# Patient Record
Sex: Male | Born: 1970 | Race: White | Hispanic: No | Marital: Married | Smoking: Current every day smoker
Health system: Southern US, Community
[De-identification: ages and names within clinical notes are randomized; demographics above are authoritative.]

---

## 2003-10-10 ENCOUNTER — Ambulatory Visit (HOSPITAL_COMMUNITY): Admission: RE | Admit: 2003-10-10 | Discharge: 2003-10-10 | Payer: Self-pay | Admitting: Orthopedic Surgery

## 2003-10-10 ENCOUNTER — Ambulatory Visit (HOSPITAL_BASED_OUTPATIENT_CLINIC_OR_DEPARTMENT_OTHER): Admission: RE | Admit: 2003-10-10 | Discharge: 2003-10-10 | Payer: Self-pay | Admitting: Orthopedic Surgery

## 2007-09-12 ENCOUNTER — Emergency Department: Payer: Self-pay | Admitting: Emergency Medicine

## 2009-05-16 IMAGING — CR DG SHOULDER 3+V*L*
1 series · 3 of 3 positions shown · non-contrast
Comparison: none

REASON FOR EXAM: fall   pt in [facility]
COMMENTS:

[Series 1: view not recorded · 0.17mm/px · 3 of 3 slices shown]
[im 1/3]
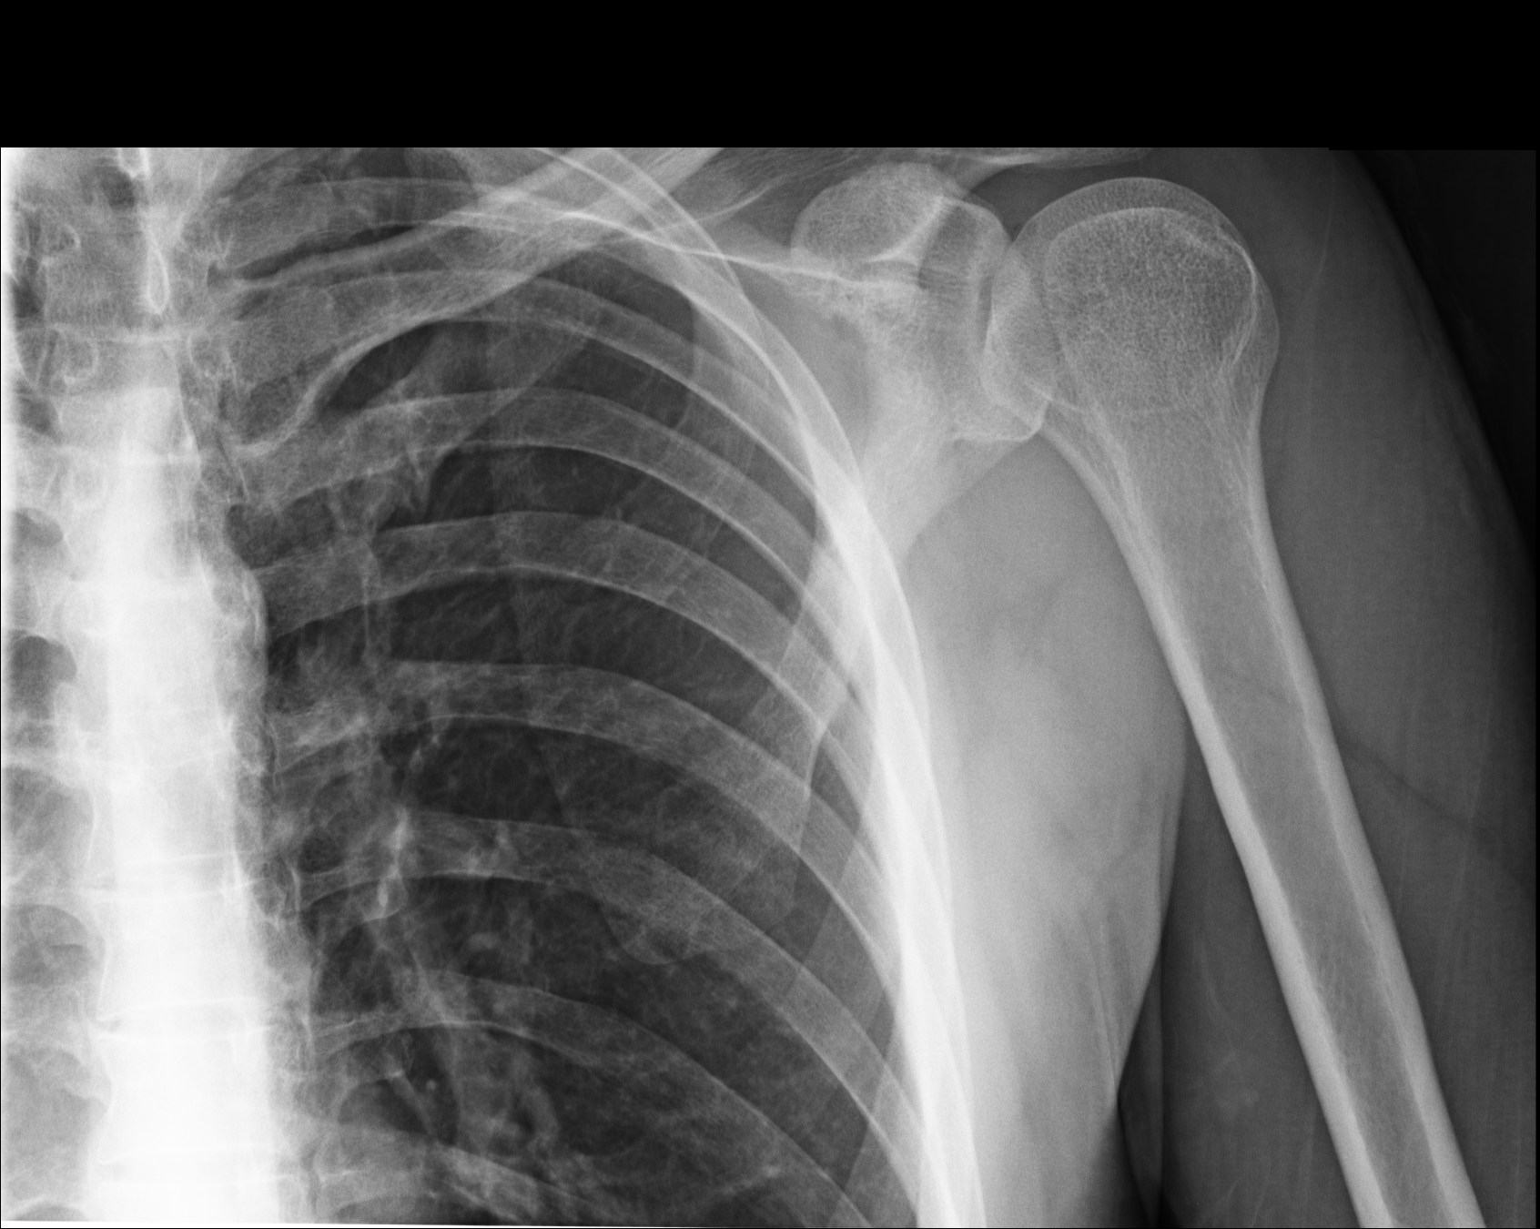
[im 2/3]
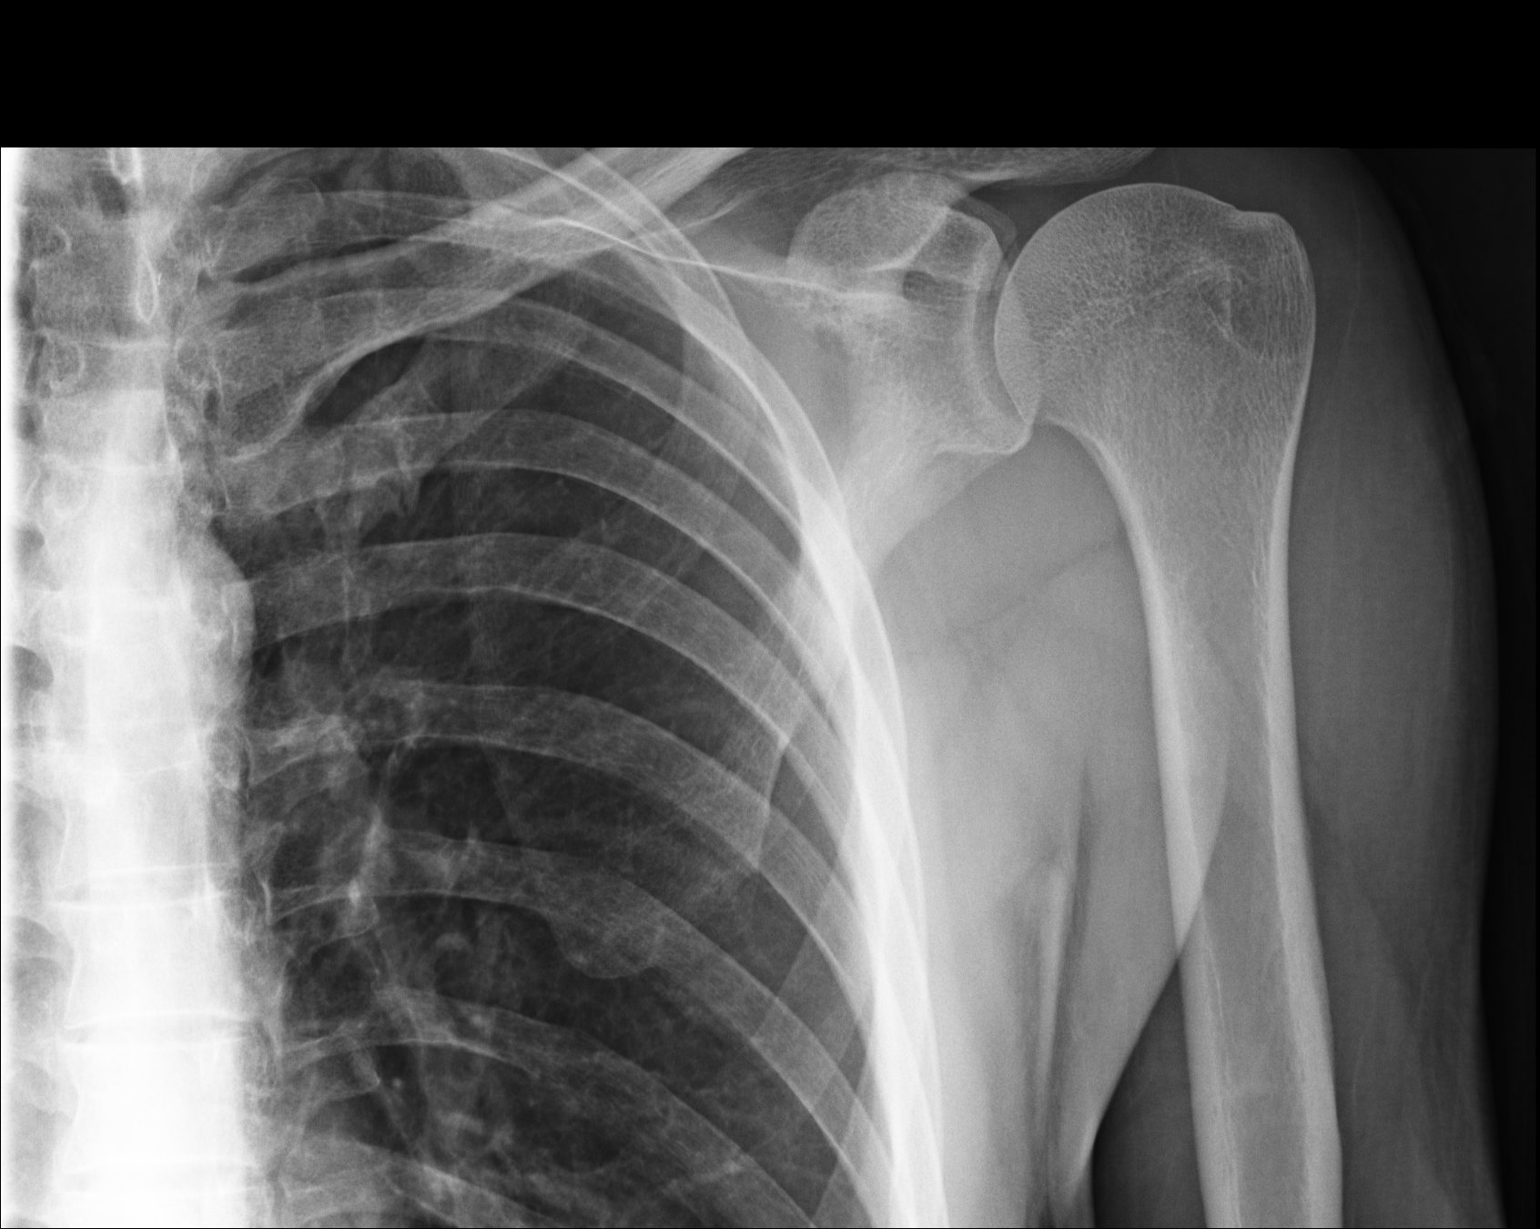
[im 3/3]
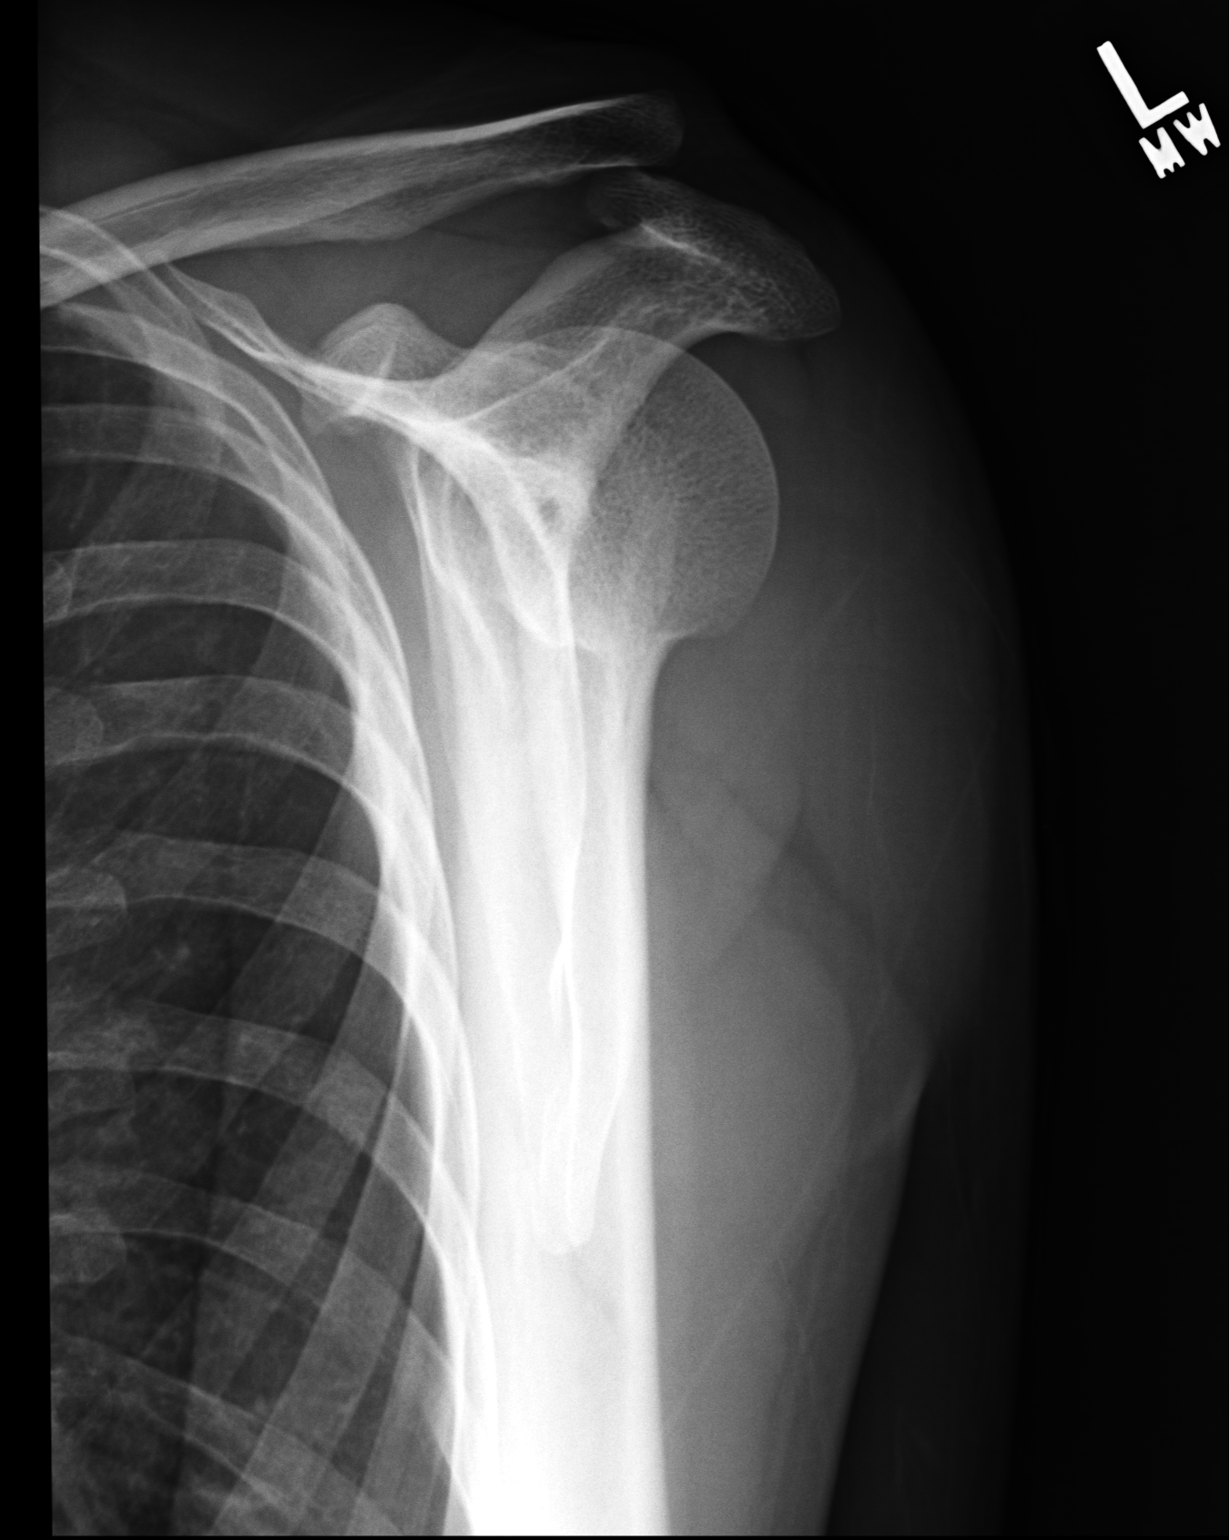

[3 of 3 positions shown; findings below may reference images not displayed]

PROCEDURE:     DXR - DXR SHOULDER LEFT COMPLETE  - September 12, 2007 [DATE]

RESULT:     The LEFT shoulder reveals the bones to be adequately
mineralized. I do not see evidence of an acute fracture. The AC joint and
glenohumeral joints are grossly intact. There is no evidence of an acute
clavicular fracture.
IMPRESSION: I see no acute bony abnormality of the LEFT shoulder.  If
there are symptoms referable to the AC joint, a dedicated AC joint series
would be useful.

## 2015-04-07 ENCOUNTER — Emergency Department
Admission: EM | Admit: 2015-04-07 | Discharge: 2015-04-07 | Disposition: A | Discharge status: Home / Self Care | Payer: Worker's Compensation | Attending: Emergency Medicine | Admitting: Emergency Medicine

## 2015-04-07 ENCOUNTER — Encounter: Payer: Self-pay | Admitting: Emergency Medicine

## 2015-04-07 ENCOUNTER — Emergency Department: Payer: Worker's Compensation

## 2015-04-07 DIAGNOSIS — S9031XA Contusion of right foot, initial encounter: Secondary | ICD-10-CM | POA: Insufficient documentation

## 2015-04-07 DIAGNOSIS — Y990 Civilian activity done for income or pay: Secondary | ICD-10-CM | POA: Insufficient documentation

## 2015-04-07 DIAGNOSIS — Y9389 Activity, other specified: Secondary | ICD-10-CM | POA: Diagnosis not present

## 2015-04-07 DIAGNOSIS — X58XXXA Exposure to other specified factors, initial encounter: Secondary | ICD-10-CM | POA: Diagnosis not present

## 2015-04-07 DIAGNOSIS — Y9289 Other specified places as the place of occurrence of the external cause: Secondary | ICD-10-CM | POA: Insufficient documentation

## 2015-04-07 DIAGNOSIS — S99921A Unspecified injury of right foot, initial encounter: Secondary | ICD-10-CM | POA: Diagnosis present

## 2015-04-07 DIAGNOSIS — F17200 Nicotine dependence, unspecified, uncomplicated: Secondary | ICD-10-CM | POA: Insufficient documentation

## 2015-04-07 MED ORDER — TRAMADOL HCL 50 MG PO TABS
50.0000 mg | ORAL_TABLET | Freq: Once | ORAL | Status: AC
  Administered 2015-04-07: 50 mg via ORAL
  Filled 2015-04-07: qty 1

## 2015-04-07 MED ORDER — NAPROXEN 500 MG PO TBEC: 500.0000 mg | DELAYED_RELEASE_TABLET | Freq: Two times a day (BID) | ORAL | Status: AC

## 2015-04-07 MED ORDER — TRAMADOL HCL 50 MG PO TABS: 50.0000 mg | ORAL_TABLET | Freq: Two times a day (BID) | ORAL | Status: AC

## 2015-04-07 NOTE — Discharge Instructions (Signed)
Foot Contusion A foot contusion is a deep bruise to the foot. Contusions are the result of an injury that caused bleeding under the skin. The contusion may turn blue, purple, or yellow. Minor injuries will give you a painless contusion, but more severe contusions may stay painful and swollen for a few weeks. CAUSES  A foot contusion comes from a direct blow to that area, such as a heavy object falling on the foot. SYMPTOMS   Swelling of the foot.  Discoloration of the foot.  Tenderness or soreness of the foot. DIAGNOSIS  You will have a physical exam and will be asked about your history. You may need an X-ray of your foot to look for a broken bone (fracture).  TREATMENT  An elastic wrap may be recommended to support your foot. Resting, elevating, and applying cold compresses to your foot are often the best treatments for a foot contusion. Over-the-counter medicines may also be recommended for pain control. HOME CARE INSTRUCTIONS   Put ice on the injured area.  Put ice in a plastic bag.  Place a towel between your skin and the bag.  Leave the ice on for 15-20 minutes, 03-04 times a day.  Only take over-the-counter or prescription medicines for pain, discomfort, or fever as directed by your caregiver.  If told, use an elastic wrap as directed. This can help reduce swelling. You may remove the wrap for sleeping, showering, and bathing. If your toes become numb, cold, or blue, take the wrap off and reapply it more loosely.  Elevate your foot with pillows to reduce swelling.  Try to avoid standing or walking while the foot is painful. Do not resume use until instructed by your caregiver. Then, begin use gradually. If pain develops, decrease use. Gradually increase activities that do not cause discomfort until you have normal use of your foot.  See your caregiver as directed. It is very important to keep all follow-up appointments in order to avoid any lasting problems with your foot,  including long-term (chronic) pain. SEEK IMMEDIATE MEDICAL CARE IF:   You have increased redness, swelling, or pain in your foot.  Your swelling or pain is not relieved with medicines.  You have loss of feeling in your foot or are unable to move your toes.  Your foot turns cold or blue.  You have pain when you move your toes.  Your foot becomes warm to the touch.  Your contusion does not improve in 2 days. MAKE SURE YOU:   Understand these instructions.  Will watch your condition.  Will get help right away if you are not doing well or get worse.   This information is not intended to replace advice given to you by your health care provider. Make sure you discuss any questions you have with your health care provider.   Document Released: 01/31/2006 Document Revised: 10/11/2011 Document Reviewed: 12/16/2014 Elsevier Interactive Patient Education [employer]2016 [employer]   Your exam and x-ray are normal following your accident. There is no evidence of fracture or dislocation. Wear the ace bandage and needed for support. Apply ice and take the prescription Naprosyn as needed for pain relief. [facility] or your company's provider as needed.

## 2015-04-07 NOTE — ED Notes (Signed)
Pt reports being at work when a large 1100lb piston rolled over the top of his foot.  Pt was wearing a steel-toe boot, and reports he worked on his foot all day after the injury.  Pt states that it wasn't until after he took his boot off that the pain began.

## 2015-04-07 NOTE — ED Notes (Signed)
Pt presents with right foot pain after a jack rolled of a dolly and onto right foot. Weight of jack was 1175lbs. Happened this am at work, now having pain and swelling.

## 2015-04-07 NOTE — ED Provider Notes (Signed)
[facility] Emergency Department Provider Note ____________________________________________  Time seen: 1725  I have reviewed the triage vital signs and the nursing notes.  HISTORY  Chief Complaint  Foot Pain  HPI Anthony Burke is a 44 y.o. male reports to the ED from the job site after he was injured at work this morning about 9 AM. He describes that a large 1175 pound jack rolled off of a dolly and onto his right foot. He continued to work throughout the day, and now presents to the ED for evaluation of his injury. He was wearing steel toe boots as is required, and denies any cuts, scrapes, or abrasions to the foot. He rates his pain a 9/10 in triage.  History reviewed. No pertinent past medical history.  There are no active problems to display for this patient.  History reviewed. No pertinent past surgical history.  Current Outpatient Rx  Name  Route  Sig  Dispense  Refill  . naproxen (EC NAPROSYN) 500 MG EC tablet   Oral   Take 1 tablet (500 mg total) by mouth 2 (two) times daily with a meal.   30 tablet   0   . traMADol (ULTRAM) 50 MG tablet   Oral   Take 1 tablet (50 mg total) by mouth 2 (two) times daily.   10 tablet   0    Allergies Review of patient's allergies indicates no known allergies.  No family history on file.  Social History Social History  Substance Use Topics  . Smoking status: Current Every Day Smoker  . Smokeless tobacco: None  . Alcohol Use: No    Review of Systems  Constitutional: Negative for fever. Eyes: Negative for visual changes. ENT: Negative for sore throat. Cardiovascular: Negative for chest pain. Respiratory: Negative for shortness of breath. Gastrointestinal: Negative for abdominal pain, vomiting and diarrhea. Genitourinary: Negative for dysuria. Musculoskeletal: Negative for back pain. Skin: Negative for rash. Neurological: Negative for headaches, focal weakness or  numbness. ____________________________________________  PHYSICAL EXAM:  VITAL SIGNS: ED Triage Vitals  Enc Vitals Group     BP 04/07/15 1708 133/85 mmHg     Pulse Rate 04/07/15 1708 110     Resp 04/07/15 1708 20     Temp 04/07/15 1708 98.5 F (36.9 C)     Temp Source 04/07/15 1708 Oral     SpO2 04/07/15 1708 99 %     Weight 04/07/15 1708 155 lb (70.308 kg)     Height 04/07/15 1708  (1.778 m)     Head Cir --      Peak Flow --      Pain Score 04/07/15 1708 9     Pain Loc --      Pain Edu? --      Excl. in GC? --     Constitutional: Alert and oriented. Well appearing and in no distress. Head: Normocephalic and atraumatic.      Eyes: Conjunctivae are normal. PERRL. Normal extraocular movements      Ears: Canals clear. TMs intact bilaterally.   Nose: No congestion/rhinorrhea.   Mouth/Throat: Mucous membranes are moist.   Neck: Supple. No thyromegaly. Hematological/Lymphatic/Immunological: No cervical lymphadenopathy. Cardiovascular: Normal rate, regular rhythm.  Respiratory: Normal respiratory effort. No wheezes/rales/rhonchi. Gastrointestinal: Soft and nontender. No distention. Musculoskeletal: Nontender with normal range of motion in all extremities.  Neurologic:  Normal gait without ataxia. Normal speech and language. No gross focal neurologic deficits are appreciated. Skin:  Skin is warm, dry and intact. No  rash noted. Psychiatric: Mood and affect are normal. Patient exhibits appropriate insight and judgment. ____________________________________________   RADIOLOGY Right Foot IMPRESSION: Negative  I, Jaryiah Mehlman, Charlesetta IvoryJenise V Bacon, personally viewed and evaluated these images (plain radiographs) as part of my medical decision making.  ____________________________________________  PROCEDURES  Ultram 50 mg PO Ace wrap ____________________________________________  INITIAL IMPRESSION / ASSESSMENT AND PLAN / ED COURSE  Patient discharged with a diagnosis of  a left foot contusion without internal derangement or evidence of fracture. He is fitted with an Ace wrap for support. He was discharged to the custody of his supervisor to due to this injury. Prescription for Naprosyn and Ultram a provider for support. He is discharged with modified duty for the remainder of this week, to primarily seated work. He will follow [facility] with [facility] or his company's provider as needed. ____________________________________________  FINAL CLINICAL IMPRESSION(S) / ED DIAGNOSES  Final diagnoses:  Foot contusion, right, initial encounter      Lissa HoardJenise V Bacon Annlee Glandon, PA-C 04/07/15 1820  Myrna Blazeravid Matthew Schaevitz, MD 04/08/15 2312

## 2016-12-09 IMAGING — CR DG FOOT COMPLETE 3+V*R*
1 series · 3 of 3 positions shown · non-contrast
Comparison: None.

CLINICAL DATA: right foot pain after [employer] [place] rolled of a dolly and
onto right foot. Weight of [employer] [place] was 3372lbs. Happened this am at
work, now having pain and swelling.

EXAM:
RIGHT FOOT COMPLETE - 3+ VIEW

[Series 1: ap · 0.17mm/px · 3 of 3 slices shown]
[im 1/3]
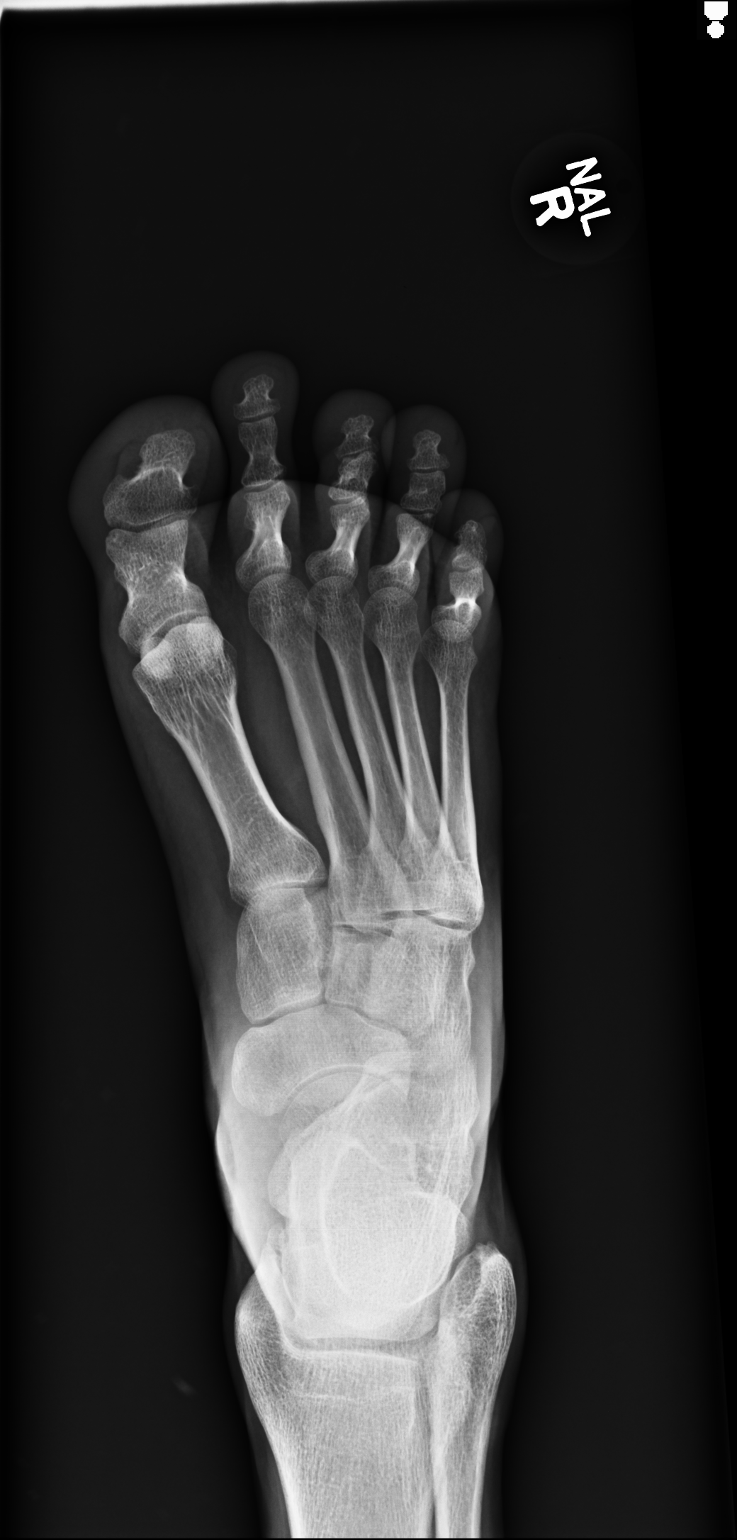
[im 2/3]
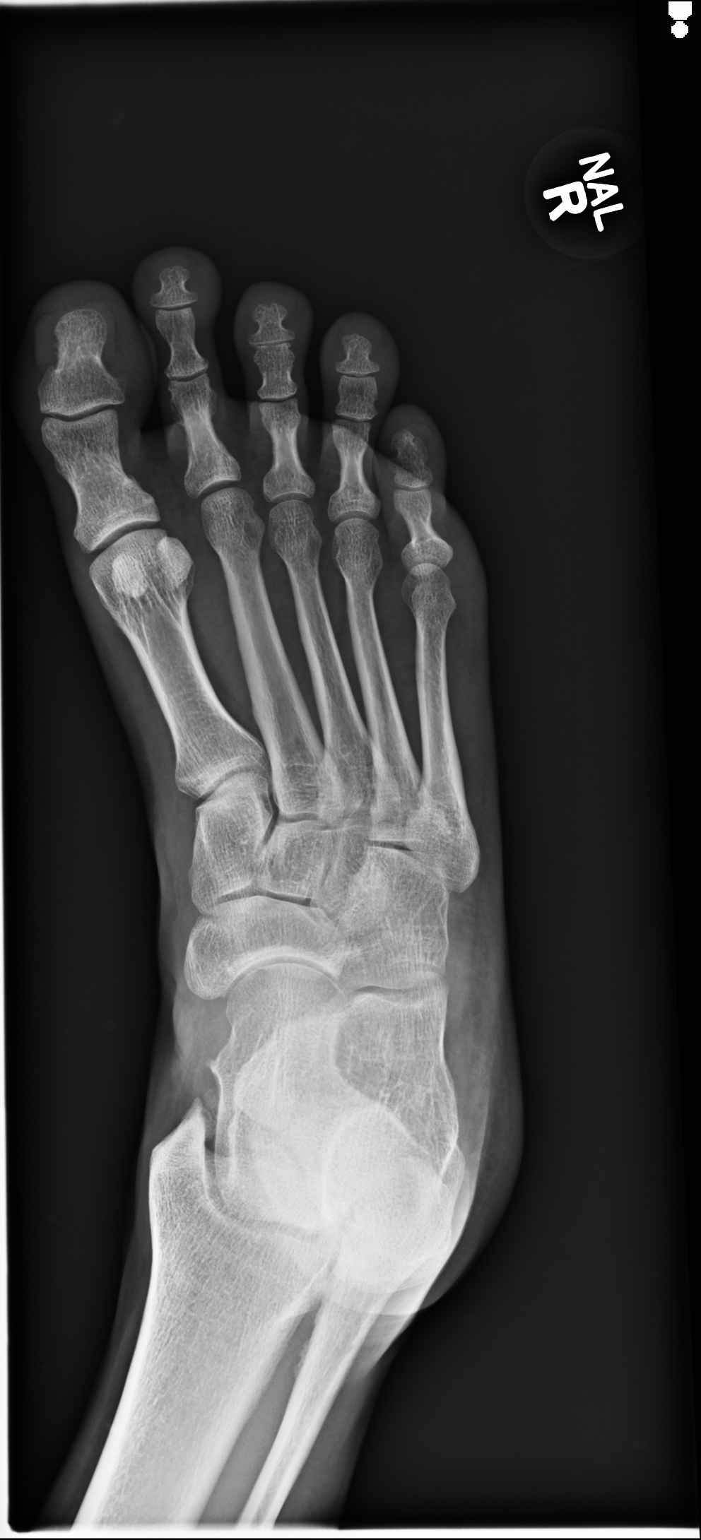
[im 3/3]
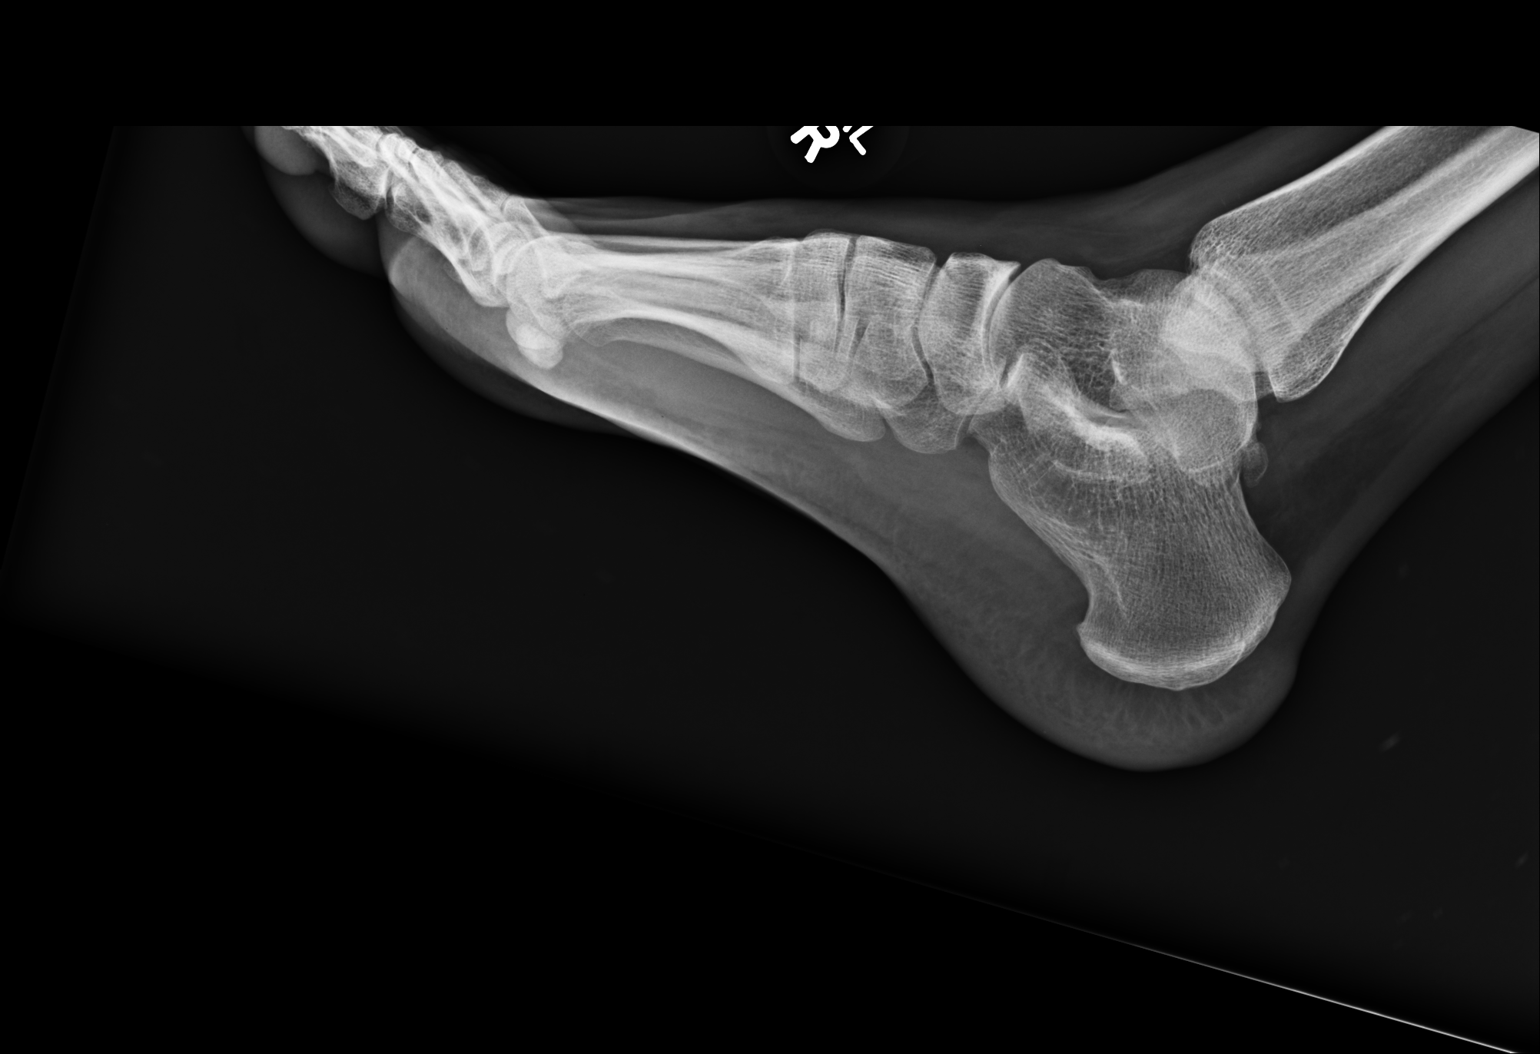

[3 of 3 positions shown; findings below may reference images not displayed]

FINDINGS: There is no evidence of fracture or dislocation. There is no
evidence of arthropathy or other focal bone abnormality. Soft
tissues are unremarkable.
IMPRESSION: Negative.
